# Patient Record
Sex: Male | Born: 1985 | Race: Black or African American | Hispanic: No | Marital: Single | Smoking: Never smoker
Health system: Southern US, Community
[De-identification: ages and names within clinical notes are randomized; demographics above are authoritative.]

## PROBLEM LIST (undated history)

## (undated) HISTORY — PX: FINGER SURGERY: SHX640

---
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
## 2014-08-16 ENCOUNTER — Encounter (HOSPITAL_COMMUNITY): Payer: Self-pay | Admitting: Emergency Medicine

## 2014-08-16 ENCOUNTER — Emergency Department (HOSPITAL_COMMUNITY): Payer: No Typology Code available for payment source

## 2014-08-16 ENCOUNTER — Emergency Department (HOSPITAL_COMMUNITY)
Admission: EM | Admit: 2014-08-16 | Discharge: 2014-08-16 | Disposition: A | Discharge status: Home / Self Care | Payer: No Typology Code available for payment source | Attending: Emergency Medicine | Admitting: Emergency Medicine

## 2014-08-16 DIAGNOSIS — S3992XA Unspecified injury of lower back, initial encounter: Secondary | ICD-10-CM | POA: Diagnosis present

## 2014-08-16 DIAGNOSIS — Y998 Other external cause status: Secondary | ICD-10-CM | POA: Diagnosis not present

## 2014-08-16 DIAGNOSIS — S39012A Strain of muscle, fascia and tendon of lower back, initial encounter: Secondary | ICD-10-CM

## 2014-08-16 DIAGNOSIS — Y92410 Unspecified street and highway as the place of occurrence of the external cause: Secondary | ICD-10-CM | POA: Diagnosis not present

## 2014-08-16 DIAGNOSIS — Y9389 Activity, other specified: Secondary | ICD-10-CM | POA: Insufficient documentation

## 2014-08-16 MED ORDER — CYCLOBENZAPRINE HCL 10 MG PO TABS: 10.0000 mg | ORAL_TABLET | Freq: Two times a day (BID) | ORAL | Status: DC | PRN

## 2014-08-16 MED ORDER — IBUPROFEN 800 MG PO TABS: 800.0000 mg | ORAL_TABLET | Freq: Three times a day (TID) | ORAL | Status: DC
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
## 2014-08-16 NOTE — Discharge Instructions (Signed)
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
## 2022-08-01 ENCOUNTER — Encounter (HOSPITAL_COMMUNITY): Payer: Self-pay | Admitting: Emergency Medicine

## 2022-08-01 ENCOUNTER — Other Ambulatory Visit: Payer: Self-pay

## 2022-08-01 ENCOUNTER — Ambulatory Visit (HOSPITAL_COMMUNITY)
Admission: EM | Admit: 2022-08-01 | Discharge: 2022-08-01 | Disposition: A | Discharge status: Home / Self Care | Payer: Medicaid Other | Attending: Family Medicine | Admitting: Family Medicine

## 2022-08-01 DIAGNOSIS — N4889 Other specified disorders of penis: Secondary | ICD-10-CM | POA: Diagnosis not present

## 2022-08-01 NOTE — ED Triage Notes (Addendum)
Bump on penis for 2 weeks.  Bump is not painful.  Patient thinks it is a hair bump.

## 2022-08-02 NOTE — ED Provider Notes (Addendum)
  [facility]   [id number] 08/01/22 Arrival Time: 1951  ASSESSMENT & PLAN:  1. Penile irritation    Ques resolving hair follicle infection. Does not look like HSV. He is comfortable with observation. May f/u as needed. OTC ibuprofen if needed.   Reviewed expectations re: course of current medical issues. Questions answered. Outlined signs and symptoms indicating need for more acute intervention. Patient verbalized understanding. After Visit Summary given.   SUBJECTIVE:  Adam Mcgee is a 37 y.o. male who presents with a smalll "bump" on penis for 2 weeks; much better now. Was never painful. Denies fever. One male sexual partner.   OBJECTIVE:  Vitals:   08/01/22 2000  BP: 123/75  Pulse: 74  Resp: 18  Temp: 98.6 F (37 C)  TempSrc: Oral  SpO2: 98%     General appearance: alert, cooperative, appears stated age and no distress GU: normal appearing genitalia except for pinpoint bump on mid L shaft of penis; no ulceration; no erythema; no bleeding/drainage Skin: warm and dry Psychological: alert and cooperative; normal mood and affect.  No results found for this or any previous visit.  Labs Reviewed - No data to display  No Known Allergies  History reviewed. No pertinent past medical history. History reviewed. No pertinent family history. Social History   Socioeconomic History   Marital status: Single    Spouse name: Not on file   Number of children: Not on file   Years of education: Not on file   Highest education level: Not on file  Occupational History   Not on file  Tobacco Use   Smoking status: Never   Smokeless tobacco: Not on file  Vaping Use   Vaping Use: Never used  Substance and Sexual Activity   Alcohol use: No   Drug use: No   Sexual activity: Not on file  Other Topics Concern   Not on file  Social History Narrative   Not on file   Social Determinants of Health   Financial Resource Strain: Not on file  Food Insecurity: Not  on file  Transportation Needs: Not on file  Physical Activity: Not on file  Stress: Not on file  Social Connections: Not on file  Intimate Partner Violence: Not on file           Mardella Layman, MD 08/02/22 [id number]    Mardella Layman, MD 08/02/22 [phone]

## 2023-06-26 ENCOUNTER — Other Ambulatory Visit: Payer: Self-pay

## 2023-06-26 ENCOUNTER — Encounter (HOSPITAL_COMMUNITY): Payer: Self-pay | Admitting: Emergency Medicine

## 2023-06-26 ENCOUNTER — Ambulatory Visit (HOSPITAL_COMMUNITY)
Admission: EM | Admit: 2023-06-26 | Discharge: 2023-06-26 | Disposition: A | Discharge status: Home / Self Care | Attending: Internal Medicine | Admitting: Internal Medicine

## 2023-06-26 DIAGNOSIS — S46811A Strain of other muscles, fascia and tendons at shoulder and upper arm level, right arm, initial encounter: Secondary | ICD-10-CM

## 2023-06-26 DIAGNOSIS — S39012A Strain of muscle, fascia and tendon of lower back, initial encounter: Secondary | ICD-10-CM

## 2023-06-26 MED ORDER — CYCLOBENZAPRINE HCL 10 MG PO TABS: 10.0000 mg | ORAL_TABLET | Freq: Three times a day (TID) | ORAL | 0 refills | Status: DC | PRN

## 2023-06-26 MED ORDER — CYCLOBENZAPRINE HCL 10 MG PO TABS: 10.0000 mg | ORAL_TABLET | Freq: Three times a day (TID) | ORAL | 0 refills | Status: AC | PRN

## 2023-06-26 MED ORDER — KETOROLAC TROMETHAMINE 10 MG PO TABS: 10.0000 mg | ORAL_TABLET | Freq: Four times a day (QID) | ORAL | 0 refills | Status: AC | PRN
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
## 2023-06-26 NOTE — ED Notes (Addendum)
 Patient added on to list of complaints that he has abdominal pain, nonspecific, denies bruising  Requesting note for his job
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
